# Patient Record
Sex: Male | Born: 1980
Health system: Southern US, Community
[De-identification: ages and names within clinical notes are randomized; demographics above are authoritative.]

## PROBLEM LIST (undated history)

## (undated) DIAGNOSIS — I10 Essential (primary) hypertension: Secondary | ICD-10-CM

## (undated) HISTORY — PX: ANKLE SURGERY: SHX546

---

## 2003-06-14 ENCOUNTER — Emergency Department (HOSPITAL_COMMUNITY): Admission: EM | Admit: 2003-06-14 | Discharge: 2003-06-14 | Payer: Self-pay | Admitting: Emergency Medicine

## 2006-03-23 ENCOUNTER — Emergency Department (HOSPITAL_COMMUNITY): Admission: EM | Admit: 2006-03-23 | Discharge: 2006-03-23 | Payer: Self-pay | Admitting: Emergency Medicine

## 2006-03-30 ENCOUNTER — Ambulatory Visit (HOSPITAL_COMMUNITY): Admission: RE | Admit: 2006-03-30 | Discharge: 2006-03-31 | Payer: Self-pay | Admitting: Orthopaedic Surgery

## 2008-09-25 IMAGING — CR DG ANKLE COMPLETE 3+V*R*
3 series · 3 of 3 positions shown · non-contrast
Comparison: none

CLINICAL DATA: Fell.
 RIGHT ANKLE ? 3 VIEW:

[t ankle joint ap right]
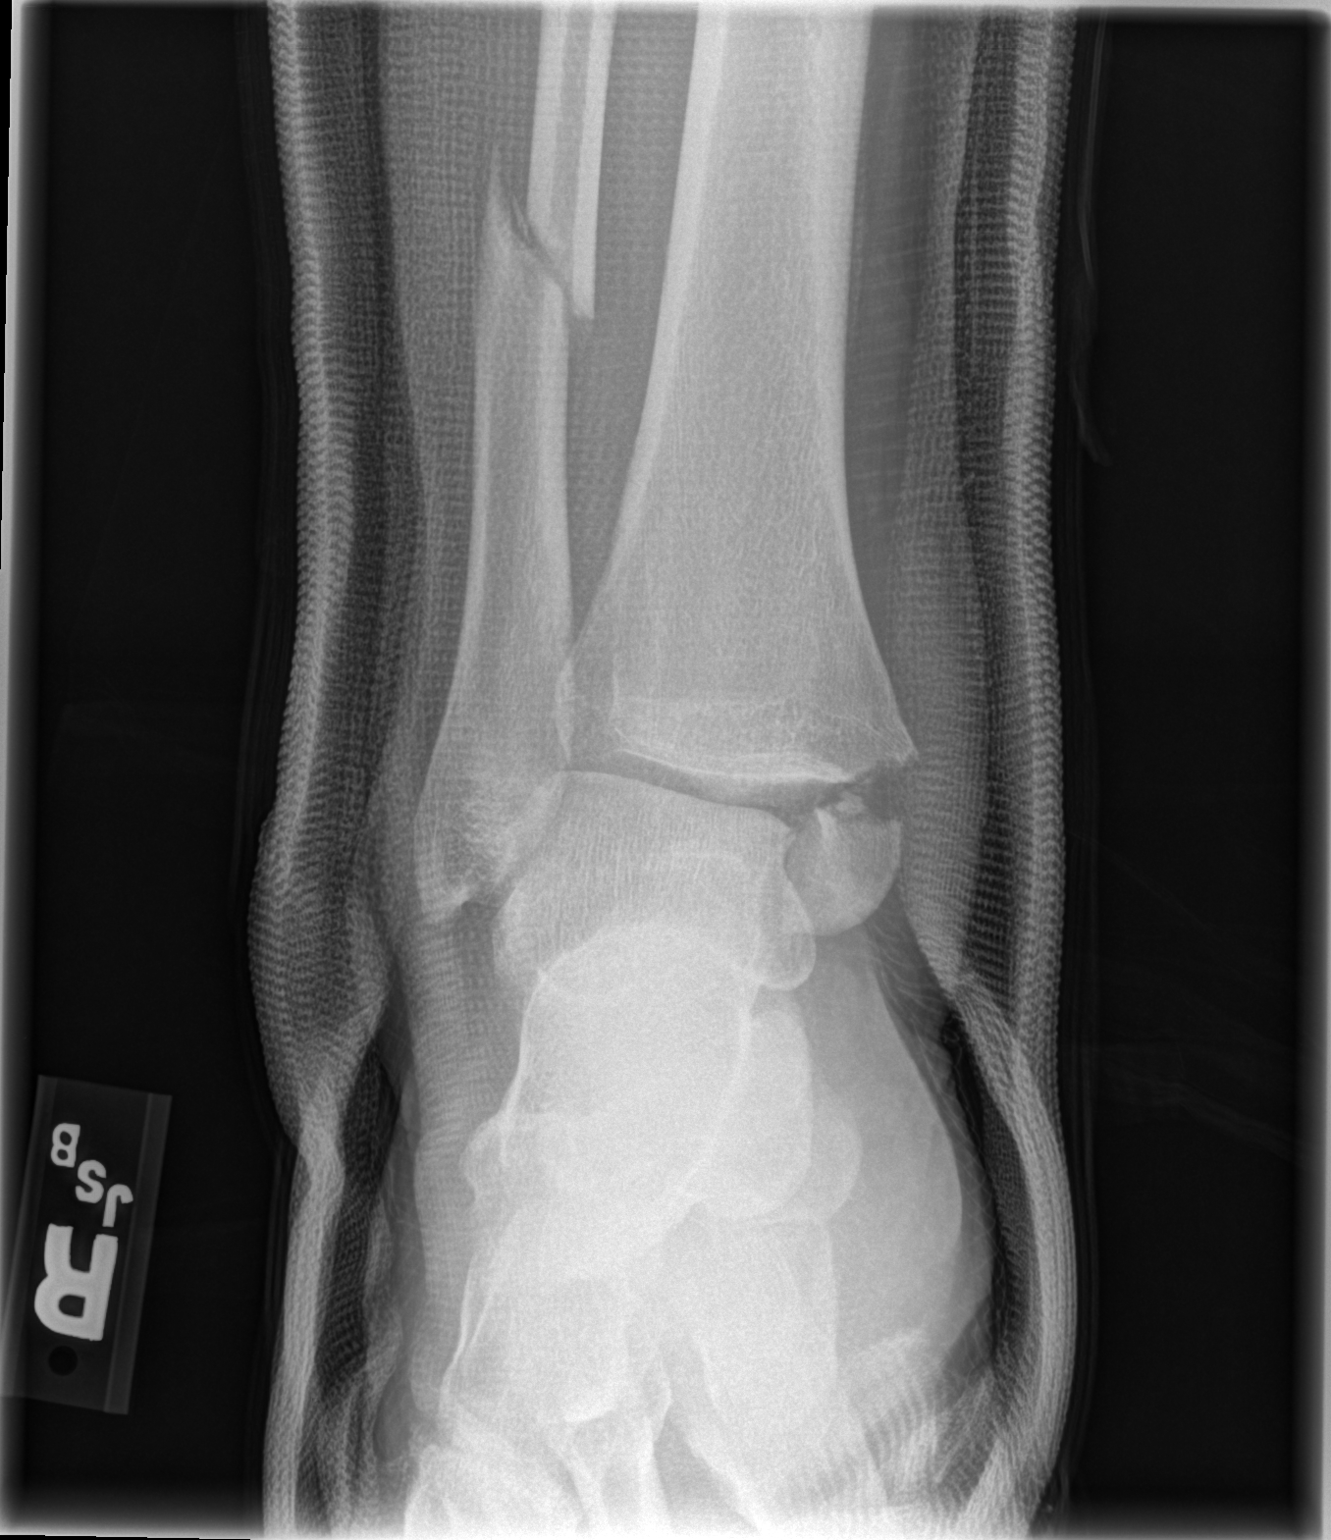

[t ankle joint oblique right]
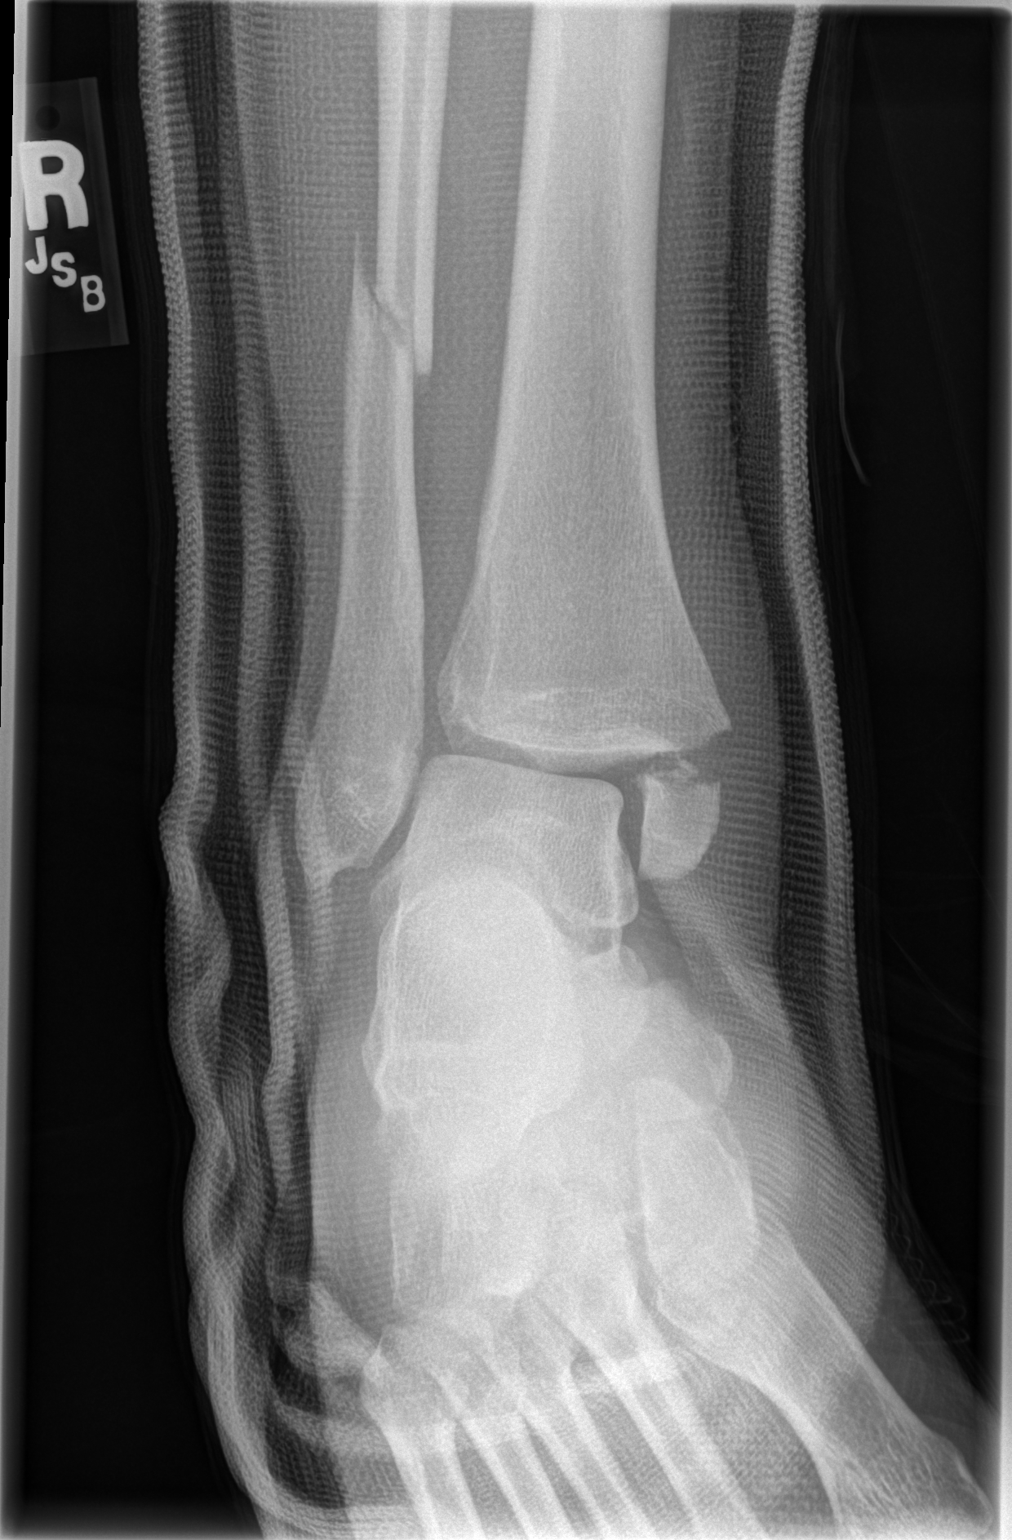

[t ankle joint lat right]
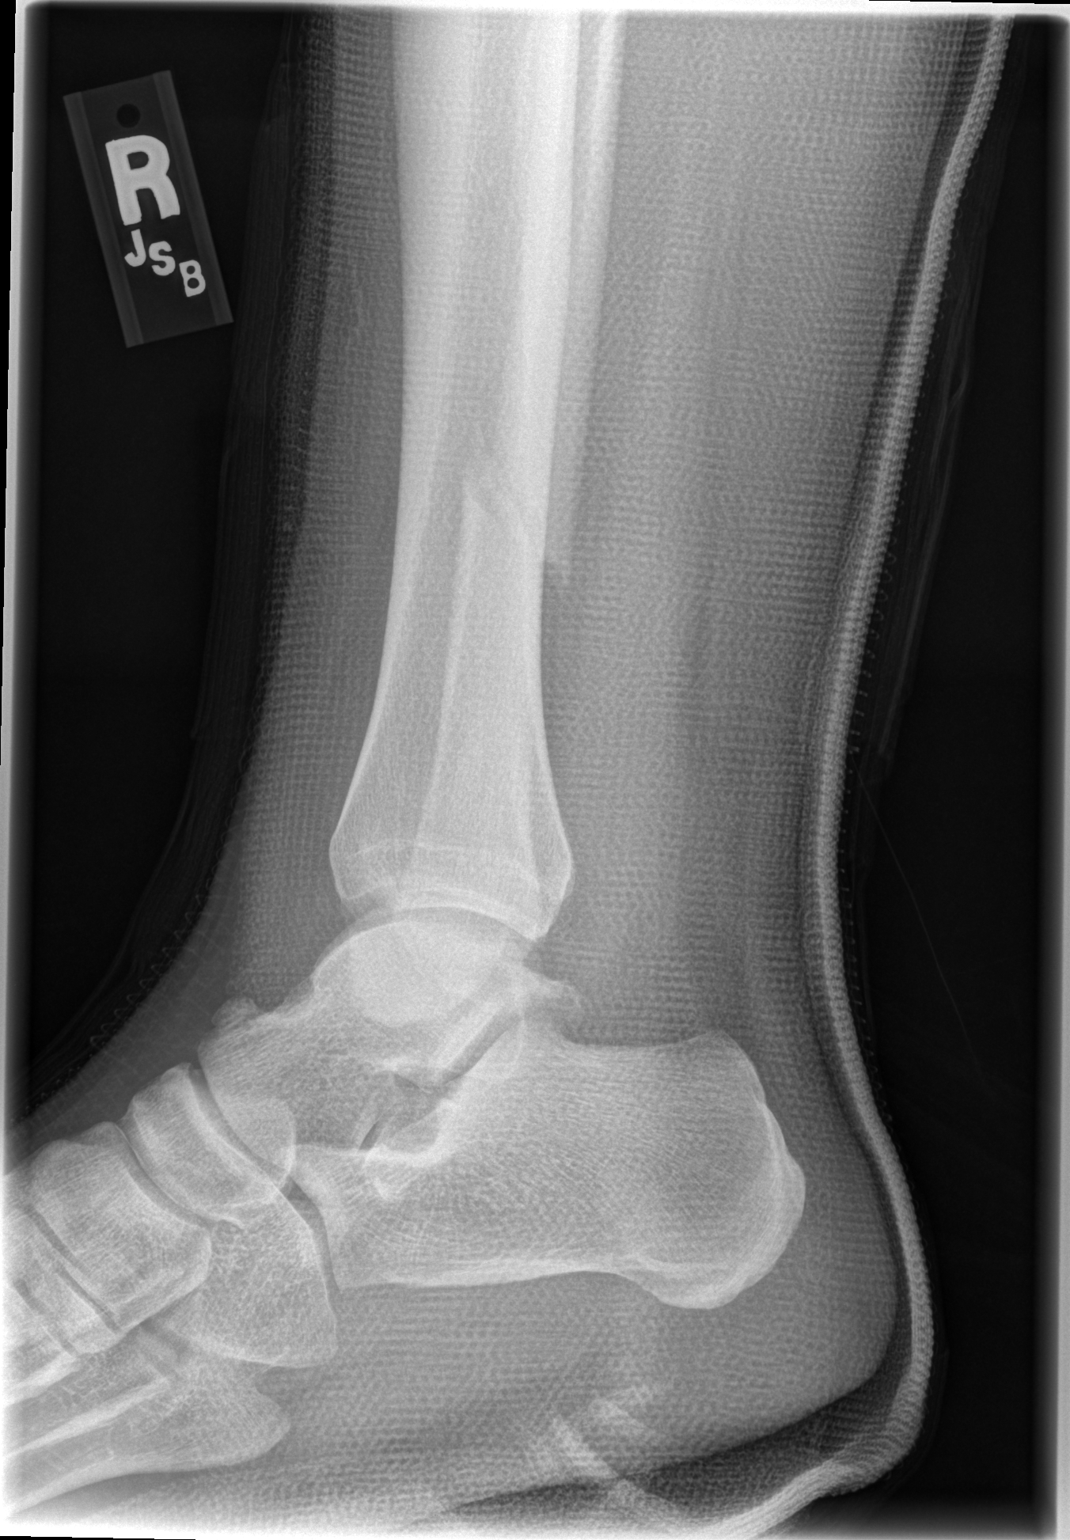

[3 of 3 positions shown; findings below may reference images not displayed]

FINDINGS: Three views through a Fiberglas splint or cast shows an oblique fracture of the distal fibular diaphysis 5 cm to 6 cm above the ankle joint, a transverse fracture of the medial malleolus, and slight widening of the ankle mortise.  No tibial lip fracture.  The bones of the hindfoot appear unremarkable.
IMPRESSION: 1.  Oblique fracture of the distal fibular diaphysis.
 2.  Transverse fracture of the medial malleolus. 
 RIGHT TIBIA AND FIBULA ? 2 VIEW:
FINDINGS: No fracture is seen proximal to the distal fibular shaft fracture.
IMPRESSION: As discussed above.

## 2019-03-11 ENCOUNTER — Other Ambulatory Visit: Payer: Self-pay

## 2019-03-11 ENCOUNTER — Encounter (HOSPITAL_COMMUNITY): Payer: Self-pay

## 2019-03-11 ENCOUNTER — Ambulatory Visit (HOSPITAL_COMMUNITY)
Admission: EM | Admit: 2019-03-11 | Discharge: 2019-03-11 | Disposition: A | Discharge status: Home / Self Care | Payer: Self-pay | Attending: Family Medicine | Admitting: Family Medicine

## 2019-03-11 DIAGNOSIS — L03012 Cellulitis of left finger: Secondary | ICD-10-CM

## 2019-03-11 HISTORY — DX: Essential (primary) hypertension: I10

## 2019-03-11 MED ORDER — MUPIROCIN 2 % EX OINT: 1.0000 "application " | TOPICAL_OINTMENT | Freq: Two times a day (BID) | CUTANEOUS | 0 refills | Status: AC

## 2019-03-11 MED ORDER — CEPHALEXIN 500 MG PO CAPS: 500.0000 mg | ORAL_CAPSULE | Freq: Four times a day (QID) | ORAL | 0 refills | Status: AC

## 2019-03-11 NOTE — ED Provider Notes (Signed)
Brian Wise    CSN: [id number] Arrival date & time: 03/11/19  7253      History   Chief Complaint Chief Complaint  Patient presents with  . Finger Pain    HPI Brian Wise is a 39 y.o. male history of hypertension presenting today for evaluation of finger pain and swelling.  Patient states that over the past few days he has noticed his left middle finger with an area of redness and swelling.  He is concerned about infection.  Notes that he has had a callus on this finger for a while now and believes he got a cut on it recently.  He denies any fall or injury.  Reports still being able to bend finger although slightly limited due to swelling.  HPI  Past Medical History:  Diagnosis Date  . Hypertension     There are no problems to display for this patient.   Past Surgical History:  Procedure Laterality Date  . ANKLE SURGERY         Home Medications    Prior to Admission medications   Medication Sig Start Date End Date Taking? Authorizing Provider  cephALEXin (KEFLEX) 500 MG capsule Take 1 capsule (500 mg total) by mouth 4 (four) times daily for 7 days. 03/11/19 03/18/19  Verle Brillhart C, PA-C  mupirocin ointment (BACTROBAN) 2 % Apply 1 application topically 2 (two) times daily. 03/11/19   Amare Kontos, Elesa Hacker, PA-C    Family History Family History  Problem Relation Age of Onset  . Cancer Mother     Social History Social History   Tobacco Use  . Smoking status: Current Every Day Smoker  Substance Use Topics  . Alcohol use: Not on file  . Drug use: Not on file     Allergies   Patient has no known allergies.   Review of Systems Review of Systems  Constitutional: Negative for fatigue and fever.  Eyes: Negative for redness, itching and visual disturbance.  Respiratory: Negative for shortness of breath.   Cardiovascular: Negative for chest pain and leg swelling.  Gastrointestinal: Negative for nausea and vomiting.  Musculoskeletal: Positive for  arthralgias and joint swelling. Negative for myalgias.  Skin: Positive for color change. Negative for rash and wound.  Neurological: Negative for dizziness, syncope, weakness, light-headedness and headaches.     Physical Exam Triage Vital Signs ED Triage Vitals  Enc Vitals Group     BP 03/11/19 0841 (!) 124/95     Pulse Rate 03/11/19 0841 99     Resp 03/11/19 0841 18     Temp 03/11/19 0841 98.2 F (36.8 C)     Temp Source 03/11/19 0841 Oral     SpO2 03/11/19 0841 94 %     Weight --      Height --      Head Circumference --      Peak Flow --      Pain Score 03/11/19 0837 7     Pain Loc --      Pain Edu? --      Excl. in [place]? --    No data found.  Updated Vital Signs BP (!) 124/95 (BP Location: Right Arm)   Pulse 99   Temp 98.2 F (36.8 C) (Oral)   Resp 18   SpO2 94%   Visual Acuity Right Eye Distance:   Left Eye Distance:   Bilateral Distance:    Right Eye Near:   Left Eye Near:    Bilateral Near:  Physical Exam Vitals and nursing note reviewed.  Constitutional:      Appearance: He is well-developed.     Comments: No acute distress  HENT:     Head: Normocephalic and atraumatic.     Nose: Nose normal.  Eyes:     Conjunctiva/sclera: Conjunctivae normal.  Cardiovascular:     Rate and Rhythm: Normal rate.  Pulmonary:     Effort: Pulmonary effort is normal. No respiratory distress.  Abdominal:     General: There is no distension.  Musculoskeletal:        General: Normal range of motion.     Cervical back: Neck supple.     Comments: Left middle finger with slightly limited range of motion at PIP, full range of motion at DIP, radial pulse 2+  Skin:    General: Skin is warm and dry.     Comments: Hardend callus overlying palmar surface of PIP of left middle finger, surrounding swelling and erythema over proximal phalanx of left middle finger  Neurological:     Mental Status: He is alert and oriented to person, place, and time.      UC Treatments /  Results  Labs (all labs ordered are listed, but only abnormal results are displayed) Labs Reviewed - No data to display  EKG   Radiology No results found.  Procedures Procedures (including critical care time)  Medications Ordered in UC Medications - No data to display  Initial Impression / Assessment and Plan / UC Course  I have reviewed the triage vital signs and the nursing notes.  Pertinent labs & imaging results that were available during my care of the patient were reviewed by me and considered in my medical decision making (see chart for details).     Exam suggestive of cellulitis, do not suspect felon/abscess.  At this time.  Initiated on Keflex as well as topical Bactroban.  Advised to have close monitoring,Discussed strict return precautions. Patient verbalized understanding and is agreeable with plan.  Final Clinical Impressions(s) / UC Diagnoses   Final diagnoses:  Cellulitis of finger of left hand     Discharge Instructions     Begin Keflex 4 times a day for the next week, take with food Soak hand/finger in warm water twice daily, dry well after soaking for 15 to 20 minutes and apply Bactroban twice daily  Please return here or emergency room if developing increased redness swelling pain, fevers or decreased motion of hand   ED Prescriptions    Medication Sig Dispense Auth. Provider   cephALEXin (KEFLEX) 500 MG capsule Take 1 capsule (500 mg total) by mouth 4 (four) times daily for 7 days. 28 capsule Kemari Mares C, PA-C   mupirocin ointment (BACTROBAN) 2 % Apply 1 application topically 2 (two) times daily. 30 g Varshini Arrants, [place] C, PA-C     PDMP not reviewed this encounter.   Lew Dawes, [place] 03/11/19 [phone]

## 2019-03-11 NOTE — Discharge Instructions (Signed)
Begin Keflex 4 times a day for the next week, take with food Soak hand/finger in warm water twice daily, dry well after soaking for 15 to 20 minutes and apply Bactroban twice daily  Please return here or emergency room if developing increased redness swelling pain, fevers or decreased motion of hand

## 2019-03-11 NOTE — ED Triage Notes (Signed)
Pt presents with swelling & pain to left middle finger for the past few days.

## 2020-01-28 ENCOUNTER — Other Ambulatory Visit: Payer: Self-pay

## 2021-09-03 ENCOUNTER — Encounter (HOSPITAL_COMMUNITY): Payer: Self-pay
